# Patient Record
Sex: Male | Born: 1980 | Hispanic: No | Marital: Married | Smoking: Never smoker
Health system: Southern US, Community
[De-identification: ages and names within clinical notes are randomized; demographics above are authoritative.]

## PROBLEM LIST (undated history)

## (undated) DIAGNOSIS — K219 Gastro-esophageal reflux disease without esophagitis: Secondary | ICD-10-CM

## (undated) HISTORY — PX: UPPER GI ENDOSCOPY: SHX6162

---

## 2016-12-17 ENCOUNTER — Emergency Department (HOSPITAL_COMMUNITY)
Admission: EM | Admit: 2016-12-17 | Discharge: 2016-12-17 | Disposition: A | Discharge status: Home / Self Care | Payer: BLUE CROSS/BLUE SHIELD | Attending: Emergency Medicine | Admitting: Emergency Medicine

## 2016-12-17 ENCOUNTER — Encounter (HOSPITAL_COMMUNITY): Payer: Self-pay

## 2016-12-17 DIAGNOSIS — Z5321 Procedure and treatment not carried out due to patient leaving prior to being seen by health care provider: Secondary | ICD-10-CM | POA: Insufficient documentation

## 2016-12-17 DIAGNOSIS — R1013 Epigastric pain: Secondary | ICD-10-CM | POA: Diagnosis present

## 2016-12-17 HISTORY — DX: Gastro-esophageal reflux disease without esophagitis: K21.9

## 2016-12-17 LAB — COMPREHENSIVE METABOLIC PANEL
ALK PHOS: 53 U/L (ref 38–126)
ALT: 24 U/L (ref 17–63)
ANION GAP: 11 (ref 5–15)
AST: 28 U/L (ref 15–41)
Albumin: 3.8 g/dL (ref 3.5–5.0)
BILIRUBIN TOTAL: 0.5 mg/dL (ref 0.3–1.2)
BUN: 13 mg/dL (ref 6–20)
CALCIUM: 9.1 mg/dL (ref 8.9–10.3)
CO2: 23 mmol/L (ref 22–32)
Chloride: 106 mmol/L (ref 101–111)
Creatinine, Ser: 1.16 mg/dL (ref 0.61–1.24)
GFR calc Af Amer: >60 mL/min (ref >60)
Glucose, Bld: 88 mg/dL (ref 65–99)
POTASSIUM: 3.6 mmol/L (ref 3.5–5.1)
Sodium: 140 mmol/L (ref 135–145)
TOTAL PROTEIN: 6.4 g/dL — AB (ref 6.5–8.1)

## 2016-12-17 LAB — CBC
HEMATOCRIT: 40.9 % (ref 39.0–52.0)
Hemoglobin: 14 g/dL (ref 13.0–17.0)
MCH: 28.9 pg (ref 26.0–34.0)
MCHC: 34.2 g/dL (ref 30.0–36.0)
MCV: 84.5 fL (ref 78.0–100.0)
Platelets: 216 10*3/uL (ref 150–400)
RBC: 4.84 MIL/uL (ref 4.22–5.81)
RDW: 12.1 % (ref 11.5–15.5)
WBC: 6.8 10*3/uL (ref 4.0–10.5)

## 2016-12-17 LAB — TYPE AND SCREEN
ABO/RH(D): B POS
ANTIBODY SCREEN: NEGATIVE

## 2016-12-17 LAB — LIPASE, BLOOD: Lipase: 65 U/L — ABNORMAL HIGH (ref 11–51)

## 2016-12-17 LAB — ABO/RH: ABO/RH(D): B POS

## 2016-12-17 NOTE — ED Triage Notes (Signed)
Pt states that he also noticed tonight around 8pm he started to have a black stool.

## 2016-12-17 NOTE — ED Triage Notes (Signed)
Pt states that he began to have upper epigastric pain tonight around 8pm along with nausea and no vomiting. Denies CP/SOB hx of GERD

## 2016-12-17 NOTE — ED Notes (Signed)
Pt states that he feels much better and wishes to leave, will return if he feels worse.

## 2017-02-19 ENCOUNTER — Observation Stay (HOSPITAL_COMMUNITY)
Admission: EM | Admit: 2017-02-19 | Discharge: 2017-02-19 | Disposition: A | Discharge status: Still In Hospital | Payer: BLUE CROSS/BLUE SHIELD | Source: Home / Self Care | Attending: Emergency Medicine | Admitting: Emergency Medicine

## 2017-02-19 ENCOUNTER — Other Ambulatory Visit: Payer: Self-pay

## 2017-02-19 ENCOUNTER — Ambulatory Visit (HOSPITAL_COMMUNITY): Payer: BLUE CROSS/BLUE SHIELD | Admitting: Anesthesiology

## 2017-02-19 ENCOUNTER — Encounter (HOSPITAL_COMMUNITY)
Admission: EM | Disposition: A | Discharge status: Still In Hospital | Payer: Self-pay | Source: Home / Self Care | Attending: Emergency Medicine

## 2017-02-19 ENCOUNTER — Encounter (HOSPITAL_COMMUNITY): Payer: Self-pay | Admitting: Emergency Medicine

## 2017-02-19 ENCOUNTER — Encounter (HOSPITAL_BASED_OUTPATIENT_CLINIC_OR_DEPARTMENT_OTHER): Payer: Self-pay | Admitting: *Deleted

## 2017-02-19 ENCOUNTER — Ambulatory Visit (HOSPITAL_BASED_OUTPATIENT_CLINIC_OR_DEPARTMENT_OTHER): Payer: BLUE CROSS/BLUE SHIELD | Admitting: Anesthesiology

## 2017-02-19 ENCOUNTER — Ambulatory Visit (HOSPITAL_BASED_OUTPATIENT_CLINIC_OR_DEPARTMENT_OTHER)
Admission: RE | Admit: 2017-02-19 | Discharge: 2017-02-19 | Disposition: A | Discharge status: Home / Self Care | Payer: BLUE CROSS/BLUE SHIELD | Source: Ambulatory Visit | Attending: Orthopedic Surgery | Admitting: Orthopedic Surgery

## 2017-02-19 ENCOUNTER — Encounter (HOSPITAL_BASED_OUTPATIENT_CLINIC_OR_DEPARTMENT_OTHER)
Admission: RE | Disposition: A | Discharge status: Home / Self Care | Payer: Self-pay | Source: Ambulatory Visit | Attending: Orthopedic Surgery

## 2017-02-19 ENCOUNTER — Emergency Department (HOSPITAL_COMMUNITY): Payer: BLUE CROSS/BLUE SHIELD

## 2017-02-19 DIAGNOSIS — K219 Gastro-esophageal reflux disease without esophagitis: Secondary | ICD-10-CM

## 2017-02-19 DIAGNOSIS — S82032A Displaced transverse fracture of left patella, initial encounter for closed fracture: Secondary | ICD-10-CM

## 2017-02-19 DIAGNOSIS — S82002A Unspecified fracture of left patella, initial encounter for closed fracture: Secondary | ICD-10-CM | POA: Diagnosis present

## 2017-02-19 DIAGNOSIS — Z79899 Other long term (current) drug therapy: Secondary | ICD-10-CM | POA: Insufficient documentation

## 2017-02-19 DIAGNOSIS — S82009A Unspecified fracture of unspecified patella, initial encounter for closed fracture: Secondary | ICD-10-CM | POA: Diagnosis present

## 2017-02-19 DIAGNOSIS — W000XXA Fall on same level due to ice and snow, initial encounter: Secondary | ICD-10-CM | POA: Insufficient documentation

## 2017-02-19 DIAGNOSIS — S82042A Displaced comminuted fracture of left patella, initial encounter for closed fracture: Secondary | ICD-10-CM

## 2017-02-19 HISTORY — PX: ORIF PATELLA: SHX5033

## 2017-02-19 SURGERY — OPEN REDUCTION INTERNAL FIXATION (ORIF) PATELLA
Anesthesia: General | Laterality: Left

## 2017-02-19 SURGERY — OPEN REDUCTION INTERNAL FIXATION (ORIF) PATELLA
Anesthesia: General | Site: Knee | Laterality: Left

## 2017-02-19 MED ORDER — ASPIRIN EC 81 MG PO TBEC: 81.0000 mg | DELAYED_RELEASE_TABLET | Freq: Every day | ORAL | 0 refills | Status: AC

## 2017-02-19 MED ORDER — LACTATED RINGERS IV SOLN: INTRAVENOUS | Status: DC

## 2017-02-19 MED ORDER — PANTOPRAZOLE SODIUM 40 MG PO TBEC
40.0000 mg | DELAYED_RELEASE_TABLET | Freq: Every day | ORAL | Status: DC
  Administered 2017-02-19: 40 mg via ORAL
  Filled 2017-02-19: qty 1

## 2017-02-19 MED ORDER — MIDAZOLAM HCL 2 MG/2ML IJ SOLN
1.0000 mg | INTRAMUSCULAR | Status: DC | PRN
  Administered 2017-02-19: 1 mg via INTRAVENOUS

## 2017-02-19 MED ORDER — FENTANYL CITRATE (PF) 100 MCG/2ML IJ SOLN
INTRAMUSCULAR | Status: AC
  Filled 2017-02-19: qty 2

## 2017-02-19 MED ORDER — HYDROMORPHONE HCL 1 MG/ML IJ SOLN: 0.2500 mg | INTRAMUSCULAR | Status: DC | PRN

## 2017-02-19 MED ORDER — OXYCODONE HCL 5 MG PO TABS: 5.0000 mg | ORAL_TABLET | ORAL | 0 refills | Status: AC | PRN

## 2017-02-19 MED ORDER — ONDANSETRON HCL 4 MG PO TABS: 4.0000 mg | ORAL_TABLET | Freq: Three times a day (TID) | ORAL | 0 refills | Status: AC | PRN

## 2017-02-19 MED ORDER — CHLORHEXIDINE GLUCONATE 4 % EX LIQD: 60.0000 mL | Freq: Once | CUTANEOUS | Status: DC

## 2017-02-19 MED ORDER — MIDAZOLAM HCL 2 MG/2ML IJ SOLN
INTRAMUSCULAR | Status: AC
  Filled 2017-02-19: qty 2

## 2017-02-19 MED ORDER — LIDOCAINE 2% (20 MG/ML) 5 ML SYRINGE
INTRAMUSCULAR | Status: AC
  Filled 2017-02-19: qty 5

## 2017-02-19 MED ORDER — GABAPENTIN 300 MG PO CAPS
300.0000 mg | ORAL_CAPSULE | Freq: Once | ORAL | Status: AC
  Administered 2017-02-19: 300 mg via ORAL

## 2017-02-19 MED ORDER — METHOCARBAMOL 500 MG PO TABS: 500.0000 mg | ORAL_TABLET | Freq: Four times a day (QID) | ORAL | 0 refills | Status: AC | PRN

## 2017-02-19 MED ORDER — HYDROMORPHONE HCL 1 MG/ML IJ SOLN
1.0000 mg | INTRAMUSCULAR | Status: DC | PRN
  Administered 2017-02-19 (×2): 1 mg via INTRAVENOUS
  Filled 2017-02-19 (×2): qty 1

## 2017-02-19 MED ORDER — ACETAMINOPHEN 500 MG PO TABS: 1000.0000 mg | ORAL_TABLET | Freq: Three times a day (TID) | ORAL | 0 refills | Status: AC

## 2017-02-19 MED ORDER — DEXAMETHASONE SODIUM PHOSPHATE 10 MG/ML IJ SOLN
INTRAMUSCULAR | Status: AC
  Filled 2017-02-19: qty 1

## 2017-02-19 MED ORDER — PROPOFOL 10 MG/ML IV BOLUS
INTRAVENOUS | Status: AC
  Filled 2017-02-19: qty 40

## 2017-02-19 MED ORDER — ONDANSETRON HCL 4 MG/2ML IJ SOLN
INTRAMUSCULAR | Status: DC | PRN
  Administered 2017-02-19: 4 mg via INTRAVENOUS

## 2017-02-19 MED ORDER — CEFAZOLIN SODIUM-DEXTROSE 2-4 GM/100ML-% IV SOLN
2.0000 g | INTRAVENOUS | Status: AC
  Administered 2017-02-19: 2 g via INTRAVENOUS

## 2017-02-19 MED ORDER — ONDANSETRON HCL 4 MG/2ML IJ SOLN
INTRAMUSCULAR | Status: AC
  Filled 2017-02-19: qty 2

## 2017-02-19 MED ORDER — SCOPOLAMINE 1 MG/3DAYS TD PT72: 1.0000 | MEDICATED_PATCH | Freq: Once | TRANSDERMAL | Status: DC | PRN

## 2017-02-19 MED ORDER — ACETAMINOPHEN 500 MG PO TABS
ORAL_TABLET | ORAL | Status: AC
  Filled 2017-02-19: qty 2

## 2017-02-19 MED ORDER — CEFAZOLIN SODIUM-DEXTROSE 2-4 GM/100ML-% IV SOLN
INTRAVENOUS | Status: AC
  Filled 2017-02-19: qty 100

## 2017-02-19 MED ORDER — LACTATED RINGERS IV SOLN
INTRAVENOUS | Status: DC
  Administered 2017-02-19 (×3): via INTRAVENOUS

## 2017-02-19 MED ORDER — GABAPENTIN 300 MG PO CAPS
ORAL_CAPSULE | ORAL | Status: AC
  Filled 2017-02-19: qty 1

## 2017-02-19 MED ORDER — HYDROMORPHONE HCL 1 MG/ML IJ SOLN
1.0000 mg | Freq: Once | INTRAMUSCULAR | Status: AC
  Administered 2017-02-19: 1 mg via INTRAVENOUS
  Filled 2017-02-19: qty 1

## 2017-02-19 MED ORDER — PROPOFOL 10 MG/ML IV BOLUS
INTRAVENOUS | Status: DC | PRN
Start: 2017-02-19 — End: 2017-02-19
  Administered 2017-02-19: 200 mg via INTRAVENOUS
  Administered 2017-02-19: 50 mg via INTRAVENOUS

## 2017-02-19 MED ORDER — BUPIVACAINE-EPINEPHRINE (PF) 0.5% -1:200000 IJ SOLN
INTRAMUSCULAR | Status: DC | PRN
  Administered 2017-02-19: 30 mL via PERINEURAL

## 2017-02-19 MED ORDER — MORPHINE SULFATE (PF) 4 MG/ML IV SOLN
4.0000 mg | Freq: Once | INTRAVENOUS | Status: AC
  Administered 2017-02-19: 4 mg via INTRAVENOUS
  Filled 2017-02-19: qty 1

## 2017-02-19 MED ORDER — DOCUSATE SODIUM 100 MG PO CAPS: 100.0000 mg | ORAL_CAPSULE | Freq: Two times a day (BID) | ORAL | 0 refills | Status: AC

## 2017-02-19 MED ORDER — DEXAMETHASONE SODIUM PHOSPHATE 10 MG/ML IJ SOLN
INTRAMUSCULAR | Status: DC | PRN
  Administered 2017-02-19: 10 mg via INTRAVENOUS

## 2017-02-19 MED ORDER — ACETAMINOPHEN 500 MG PO TABS
1000.0000 mg | ORAL_TABLET | Freq: Once | ORAL | Status: AC
  Administered 2017-02-19: 1000 mg via ORAL

## 2017-02-19 MED ORDER — FENTANYL CITRATE (PF) 100 MCG/2ML IJ SOLN
50.0000 ug | INTRAMUSCULAR | Status: DC | PRN
  Administered 2017-02-19: 25 ug via INTRAVENOUS
  Administered 2017-02-19: 50 ug via INTRAVENOUS

## 2017-02-19 MED ORDER — LIDOCAINE 2% (20 MG/ML) 5 ML SYRINGE
INTRAMUSCULAR | Status: DC | PRN
  Administered 2017-02-19: 40 mg via INTRAVENOUS

## 2017-02-19 MED ORDER — ONDANSETRON HCL 4 MG/2ML IJ SOLN: 4.0000 mg | Freq: Three times a day (TID) | INTRAMUSCULAR | Status: DC | PRN

## 2017-02-19 SURGICAL SUPPLY — 61 items
BANDAGE ACE 4X5 VEL STRL LF (GAUZE/BANDAGES/DRESSINGS) ×4 IMPLANT
BANDAGE ESMARK 6X9 LF (GAUZE/BANDAGES/DRESSINGS) ×2 IMPLANT
BIT DRILL 2.6 CANN (BIT) ×2 IMPLANT
BLADE SURG 15 STRL LF DISP TIS (BLADE) ×4 IMPLANT
BLADE SURG 15 STRL SS (BLADE) ×4
BNDG COHESIVE 4X5 TAN STRL (GAUZE/BANDAGES/DRESSINGS) IMPLANT
BNDG ESMARK 6X9 LF (GAUZE/BANDAGES/DRESSINGS) ×4
CHLORAPREP W/TINT 26ML (MISCELLANEOUS) ×4 IMPLANT
CLSR STERI-STRIP ANTIMIC 1/2X4 (GAUZE/BANDAGES/DRESSINGS) ×4 IMPLANT
CUFF TOURNIQUET SINGLE 24IN (TOURNIQUET CUFF) IMPLANT
CUFF TOURNIQUET SINGLE 34IN LL (TOURNIQUET CUFF) IMPLANT
DECANTER SPIKE VIAL GLASS SM (MISCELLANEOUS) IMPLANT
DRAPE C-ARM 42X72 X-RAY (DRAPES) ×4 IMPLANT
DRAPE C-ARMOR (DRAPES) ×4 IMPLANT
DRAPE EXTREMITY T 121X128X90 (DRAPE) ×4 IMPLANT
DRAPE OEC MINIVIEW 54X84 (DRAPES) ×4 IMPLANT
DRAPE U-SHAPE 47X51 STRL (DRAPES) IMPLANT
DRSG EMULSION OIL 3X3 NADH (GAUZE/BANDAGES/DRESSINGS) IMPLANT
ELECT REM PT RETURN 9FT ADLT (ELECTROSURGICAL) ×4
ELECTRODE REM PT RTRN 9FT ADLT (ELECTROSURGICAL) ×2 IMPLANT
FIBERTAPE 2 W/STRL NDL 17 (SUTURE) ×2 IMPLANT
GAUZE SPONGE 4X4 12PLY STRL (GAUZE/BANDAGES/DRESSINGS) ×4 IMPLANT
GLOVE BIO SURGEON STRL SZ7.5 (GLOVE) ×8 IMPLANT
GLOVE BIOGEL PI IND STRL 8 (GLOVE) ×4 IMPLANT
GLOVE BIOGEL PI INDICATOR 8 (GLOVE) ×4
GOWN STRL REUS W/ TWL LRG LVL3 (GOWN DISPOSABLE) ×8 IMPLANT
GOWN STRL REUS W/ TWL XL LVL3 (GOWN DISPOSABLE) ×2 IMPLANT
GOWN STRL REUS W/TWL LRG LVL3 (GOWN DISPOSABLE) ×8
GOWN STRL REUS W/TWL XL LVL3 (GOWN DISPOSABLE) ×2
IMMOBILIZER KNEE 22 UNIV (SOFTGOODS) IMPLANT
IMMOBILIZER KNEE 24 THIGH 36 (MISCELLANEOUS) IMPLANT
IMMOBILIZER KNEE 24 UNIV (MISCELLANEOUS)
K-WIRE 1.6X200 (WIRE) ×4
KWIRE 1.6X200 (WIRE) ×2 IMPLANT
NEEDLE HYPO 22GX1.5 SAFETY (NEEDLE) IMPLANT
NS IRRIG 1000ML POUR BTL (IV SOLUTION) ×4 IMPLANT
PACK ARTHROSCOPY DSU (CUSTOM PROCEDURE TRAY) IMPLANT
PACK BASIN DAY SURGERY FS (CUSTOM PROCEDURE TRAY) ×4 IMPLANT
PAD CAST 4YDX4 CTTN HI CHSV (CAST SUPPLIES) ×2 IMPLANT
PADDING CAST COTTON 4X4 STRL (CAST SUPPLIES) ×2
PENCIL BUTTON HOLSTER BLD 10FT (ELECTRODE) ×4 IMPLANT
SCREW 4X36MM CANN LO-PRO (Screw) ×2 IMPLANT
SCREW CANN 4X34 LO PRO (Screw) ×2 IMPLANT
SLEEVE SCD COMPRESS KNEE MED (MISCELLANEOUS) IMPLANT
SPLINT FAST PLASTER 5X30 (CAST SUPPLIES)
SPLINT PLASTER CAST FAST 5X30 (CAST SUPPLIES) IMPLANT
SPONGE LAP 4X18 X RAY DECT (DISPOSABLE) ×4 IMPLANT
SUCTION FRAZIER HANDLE 10FR (MISCELLANEOUS)
SUCTION TUBE FRAZIER 10FR DISP (MISCELLANEOUS) IMPLANT
SUT ETHILON 3 0 PS 1 (SUTURE) ×4 IMPLANT
SUT MON AB 2-0 CT1 36 (SUTURE) ×4 IMPLANT
SUT MON AB 4-0 PC3 18 (SUTURE) ×4 IMPLANT
SUT VIC AB 0 SH 27 (SUTURE) IMPLANT
SUT VIC AB 2-0 SH 27 (SUTURE)
SUT VIC AB 2-0 SH 27XBRD (SUTURE) IMPLANT
SYR BULB 3OZ (MISCELLANEOUS) ×4 IMPLANT
TOWEL OR 17X24 6PK STRL BLUE (TOWEL DISPOSABLE) ×4 IMPLANT
TOWEL OR NON WOVEN STRL DISP B (DISPOSABLE) ×4 IMPLANT
TUBE CONNECTING 20X1/4 (TUBING) IMPLANT
UNDERPAD 30X30 (UNDERPADS AND DIAPERS) ×4 IMPLANT
YANKAUER SUCT BULB TIP NO VENT (SUCTIONS) ×4 IMPLANT

## 2017-02-19 NOTE — Progress Notes (Signed)
Discharge paperwork reviewed with patient. No questions verbalized.  [facility] called to set up transport to the [facility].  Will continue to monitor.

## 2017-02-19 NOTE — Anesthesia Postprocedure Evaluation (Signed)
Anesthesia Post Note  Patient: Nathan Davenport  Procedure(s) Performed: OPEN REDUCTION INTERNAL (ORIF) FIXATION PATELLA (Left Knee)     Patient location during evaluation: PACU Anesthesia Type: General Level of consciousness: awake and alert Pain management: pain level controlled Vital Signs Assessment: post-procedure vital signs reviewed and stable Respiratory status: spontaneous breathing, nonlabored ventilation, respiratory function stable and patient connected to nasal cannula oxygen Cardiovascular status: blood pressure returned to baseline and stable Postop Assessment: no apparent nausea or vomiting Anesthetic complications: no    Last Vitals:  Vitals:   02/19/17 1500 02/19/17 1515  BP: (!) 100/57 112/67  Pulse: 92 84  Resp: 11 11  Temp:    SpO2: 100% 94%    Last Pain:  Vitals:   02/19/17 1500  TempSrc:   PainSc: 0-No pain                 Phillips Groutarignan, Myrtice Lowdermilk

## 2017-02-19 NOTE — ED Triage Notes (Signed)
Per [facility], Pt slipped and fell in parking lot, landed on L knee. Pt has deformity to L knee. Pt denies loss of consciousness, denies hitting head, denies blood thinners. Pt received 250 mcg fentanyl en route. Pt alert, reports pain 5/10.

## 2017-02-19 NOTE — ED Provider Notes (Signed)
[facility] Provider Note   CSN: [id number] Arrival date & time: 02/19/17  0000     History   Chief Complaint Chief Complaint  Patient presents with  . Knee Injury    HPI Nathan AmabileSameh Davenport is a 36 y.o. male.  The history is provided by the patient and medical records.    36 year old male with hx of GERD, presenting to the ED after a fall. Patient reports he slipped on some black ice at his house and fell directly onto the left knee.  States as soon as he fell he had immediate pain and noted deformity to the left knee.  Unable to get up and walk around afterwards.  No head injury or LOC.  Not on anticoagulation.  No prior orthopedic injuries.  Given fentanyl en route with some relief of pain.  Past Medical History:  Diagnosis Date  . GERD (gastroesophageal reflux disease)     There are no active problems to display for this patient.   Past Surgical History:  Procedure Laterality Date  . UPPER GI ENDOSCOPY         Home Medications    Prior to Admission medications   Not on File    Family History No family history on file.  Social History Social History   Tobacco Use  . Smoking status: Never Smoker  . Smokeless tobacco: Never Used  Substance Use Topics  . Alcohol use: No  . Drug use: Not on file     Allergies   Sulfa antibiotics   Review of Systems Review of Systems  Musculoskeletal: Positive for arthralgias.  All other systems reviewed and are negative.    Physical Exam Updated Vital Signs BP 132/72 (BP Location: Right Arm)   Pulse (!) 103   Temp 98.4 F (36.9 C) (Oral)   Resp 20   Ht 5\' 11"  (1.803 m)   Wt 91.2 kg (201 lb)   SpO2 97%   BMI 28.03 kg/m   Physical Exam  Constitutional: He is oriented to person, place, and time. He appears well-developed and well-nourished.  HENT:  Head: Normocephalic and atraumatic.  Mouth/Throat: Oropharynx is clear and moist.  Eyes: Conjunctivae and EOM are  normal. Pupils are equal, round, and reactive to light.  Neck: Normal range of motion.  Cardiovascular: Normal rate, regular rhythm and normal heart sounds.  Pulmonary/Chest: Effort normal and breath sounds normal. No stridor. No respiratory distress.  Abdominal: Soft. Bowel sounds are normal.  Musculoskeletal: Normal range of motion.  Apparent deformity of the patella, appears to be fractured with fragments displaced; no skin tenting; limited range of motion secondary to this, DP pulse intact, foot is warm and well-perfused  Neurological: He is alert and oriented to person, place, and time.  Skin: Skin is warm and dry.  Psychiatric: He has a normal mood and affect.  Nursing note and vitals reviewed.    ED Treatments / Results  Labs (all labs ordered are listed, but only abnormal results are displayed) Labs Reviewed - No data to display  EKG  EKG Interpretation None       Radiology Dg Knee Complete 4 Views Left  Result Date: 02/19/2017 CLINICAL DATA:  Larey SeatFell on the ice tonight. EXAM: LEFT KNEE - COMPLETE 4+ VIEW COMPARISON:  None. FINDINGS: Comminuted patellar fracture sugar with marked fracture fragment separation and rotation. Femur, tibia and fibula appear intact. IMPRESSION: Comminuted, widely separated patellar fracture. Electronically Signed   By: Ellery Plunkaniel R Mitchell M.D.   On:  02/19/2017 01:38    Procedures Procedures (including critical care time)  Medications Ordered in ED Medications  ondansetron (ZOFRAN) injection 4 mg (not administered)  HYDROmorphone (DILAUDID) injection 1 mg (1 mg Intravenous Given 02/19/17 0321)  morphine 4 MG/ML injection 4 mg (4 mg Intravenous Given 02/19/17 0031)  HYDROmorphone (DILAUDID) injection 1 mg (1 mg Intravenous Given 02/19/17 0117)     Initial Impression / Assessment and Plan / ED Course  I have reviewed the triage vital signs and the nursing notes.  Pertinent labs & imaging results that were available during my care of the  patient were reviewed by me and considered in my medical decision making (see chart for details).  36 year old male here after slip and fall on ice at home.  He was evaluated immediately upon arrival.  On exam he has obvious deformity of the left knee consistent with a patella fracture which I discussed with him.  X-ray studies were obtained confirming such.  He is to be placed in knee immobilizer with crutches. Leg remains neurovascular intact, no skin tenting or compromise.  Patient somewhat concerned about follow-up but after discussing with him, he is comfortable and acknowledged understanding.  Orthopedic surgery paged to facilitate follow-up.  1:44 AM Spoke with Dr. Eulah PontMurphy with orthopedics-- he has reviewed images.  He will see patient in office this morning at 8:30AM (<7 hours from now), can schedule surgery later this week, as early as tomorrow.  Agrees with immobilizer and oral meds.  Patient and wife updated, they acknowledged understanding.  Additional meds ordered prior to immobilizer being put on.  1:52 AM Called back into room by friends of patient (hospitalist physicians) that have now arrived as they have been knocking on provider room door and looking through windows. They are now refusing to let him be discharged.  Patient states pain is too great at this time, informed him I have ordered additional medications.  He had some concerns about desaturation with fentanyl, however was given a very large dose (250 mcg) on the way here with EMS which i feel is the culprit so have held further doses of this. Friends at bedside are somewhat aggressive and hostile towards me-- they are unhappy that he is not having surgery tonight and I have tried to explain reasoning, however they continue interrupting me.  They are adamant that he be evaluated by a hospitalist (direct words "I suggest you call a hospitalist and get him admitted because this is unacceptable") at this time even though I have explained  that medicine team likely will not offer any additional benefit at this time as he has an isolated orthopedic injury.  I have tried to explain planning further, however they will not let me speak as they continue to deem my plan "unaccepable".  I voiced that I would place a call but could not promise anything as discharge home with OP follow-up is standard of care.  They continue coming in and out of room, knocking on doors and peering through provider room windows even after being informed I was on the phone with another consultation about a different patient.  1:57 AM Have reached back out to Dr. Eulah PontMurphy-- he has spoken with patient one on one over the phone.  He agrees that outpatient plan was correct, however since patient is refusing to leave and friends are being difficult, will admit for observation.  He made patient aware that he could have surgical intervention done faster at OP center, patient still refusing.  I  have placed temporary admission orders.  Patient to remain NPO.  2:20 AM Notified by secretary that patient's friends have been standing around her desk asking for hospitalist and/or other physicians to be contacted.  They have been told of the new plan and told to go back into room for HIPPA reasons.  Charge nurse has been notified of situation as well.    Patient's visitors were observed walking into the provider lounge on the opposite side of ED to talk with other physician that was not involved in case after being told to wait in exam room by secretary staff.  Apparently, they were voicing to other staff members that "nothing was being done".  Attending physician, Dr. Nicanor Alcon, aware of patient's injuries/ treatment plan and is in agreement with such.  She also has witnessed the behavior of patient's guests.  Final Clinical Impressions(s) / ED Diagnoses   Final diagnoses:  Closed displaced transverse fracture of left patella, initial encounter    ED Discharge Orders    None        Garlon Hatchet, PA-C 02/19/17 0426    Palumbo, April, MD 02/19/17 0500

## 2017-02-19 NOTE — Op Note (Signed)
02/19/2017  1:42 PM  PATIENT:  Nathan Davenport    PRE-OPERATIVE DIAGNOSIS:  fracture patella left  POST-OPERATIVE DIAGNOSIS:  Same  PROCEDURE:  OPEN REDUCTION INTERNAL (ORIF) FIXATION PATELLA  SURGEON:  Jerie Basford D, MD  PHYSICIAN ASSISTANT: Aquilla HackerHenry Martensen, PA-C, he was present and scrubbed throughout the case, critical for completion in a timely fashion, and for retraction, instrumentation, and closure.   ANESTHESIA:   General  PREOPERATIVE INDICATIONS:  Nathan Davenport is a  36 y.o. male with a diagnosis of fracture patella left who elected for surgical management in order to restore the function of the extensor mechanism.    The risks benefits and alternatives were discussed with the patient preoperatively including but not limited to the risks of infection, bleeding, nerve injury, cardiopulmonary complications, the need for revision surgery, hardware prominence, hardware failure, the need for hardware removal, nonunion, malunion, posttraumatic arthritis, stiffness, loss of strength and function, among others, and the patient was willing to proceed.  OPERATIVE IMPLANTS: 4.0 mm cannulated screws x2 with a total of 2 #2 FiberWire going through the cannulated screws in a figure-of-eight cerclage fashion  OPERATIVE FINDINGS: Displaced patella fracture  OPERATIVE PROCEDURE: The patient was brought to the operating room and placed in the supine position. General anesthesia was administered. IV antibiotics were given. The lower extremity was prepped and draped in usual sterile fashion. The leg was elevated and exsanguinated and the tourniquet was inflated. Time out was performed.   Anterior incision was made over the patella and the fracture fragments identified and cleaned of hematoma. The retinaculum was torn on either side.  Unfortunately he had a large amount of comminution in his inferior patella.  I was able to hold this into place and obtain an articular reduction.  Is able to get  purchase with 2 screws.  I reduced the fracture anatomically and held provisionally with a clamp and placed 2 guidewires for the cannulated screws.  The lengths were measured, after being confirmed on C-arm, and then I placed the screws, taking care to make sure that there were threads only on the proximal segment, providing compression at the fracture site, and the tips were not prominent proximally.  C-arm used to confirm reduction and position of the screws, and once I was satisfied with this I then used a Keith needle through the screws bringing a total of 2 #2 FiberWire in a figure-of-eight type fashion. This provided excellent secondary fixation. I left the screw lengths slightly short of the far cortex in order to minimize the risk for rupture of the FiberWire over the tip of the screws.  I used a fiber tape to place a cerclage stitch around his entire patella to contain the inferior comminution.  He was stable to 20 degrees of flexion.  The wounds were irrigated copiously.  I performed a repair of the collateral joint capsul and extensor retinaculum that had ruptured. I was happy with this closure.   I used Vicryl for the subcutaneous tissue with Steri-Strips and sterile gauze for the skin. The wounds were also injected. A knee immobilizer was applied. The patient was awakened and returned to the PACU in stable and satisfactory condition. There were no complications.   POSTOPERATIVE PLAN: WBAT in knee immobilizer, DVT px: ambulation and ASA

## 2017-02-19 NOTE — Anesthesia Procedure Notes (Signed)
Procedure Name: LMA Insertion Date/Time: 02/19/2017 12:47 PM Performed by: Burna Cashonrad, Carmin Alvidrez C, CRNA Pre-anesthesia Checklist: Patient identified, Emergency Drugs available, Suction available and Patient being monitored Patient Re-evaluated:Patient Re-evaluated prior to induction Oxygen Delivery Method: Circle system utilized Preoxygenation: Pre-oxygenation with 100% oxygen Induction Type: IV induction Ventilation: Mask ventilation without difficulty LMA: LMA inserted LMA Size: 5.0 Number of attempts: 1 Airway Equipment and Method: Bite block Placement Confirmation: positive ETCO2 Tube secured with: Tape Dental Injury: Teeth and Oropharynx as per pre-operative assessment

## 2017-02-19 NOTE — Anesthesia Preprocedure Evaluation (Deleted)
Anesthesia Evaluation  Patient identified by MRN, date of birth, ID band Patient awake    Reviewed: Allergy & Precautions, H&P , NPO status , Patient's Chart, lab work & pertinent test results  Airway Mallampati: II  TM Distance: >3 FB Neck ROM: Full    Dental no notable dental hx. (+) Teeth Intact, Dental Advisory Given   Pulmonary neg pulmonary ROS,    Pulmonary exam normal breath sounds clear to auscultation       Cardiovascular negative cardio ROS   Rhythm:Regular Rate:Normal     Neuro/Psych negative neurological ROS  negative psych ROS   GI/Hepatic Neg liver ROS, GERD  Medicated and Controlled,  Endo/Other  negative endocrine ROS  Renal/GU negative Renal ROS  negative genitourinary   Musculoskeletal   Abdominal   Peds  Hematology negative hematology ROS (+)   Anesthesia Other Findings   Reproductive/Obstetrics negative OB ROS                            Anesthesia Physical Anesthesia Plan  ASA: II  Anesthesia Plan: General   Post-op Pain Management:  Regional for Post-op pain   Induction: Intravenous  PONV Risk Score and Plan: 3 and Ondansetron, Dexamethasone and Midazolam  Airway Management Planned: LMA  Additional Equipment:   Intra-op Plan:   Post-operative Plan: Extubation in OR  Informed Consent: I have reviewed the patients History and Physical, chart, labs and discussed the procedure including the risks, benefits and alternatives for the proposed anesthesia with the patient or authorized representative who has indicated his/her understanding and acceptance.   Dental advisory given  Plan Discussed with: CRNA  Anesthesia Plan Comments:         Anesthesia Quick Evaluation  

## 2017-02-19 NOTE — Discharge Instructions (Signed)
Elevate leg - Toes above nose as much as possible to reduce pain / swelling.  Weight Bearing:  As tolerated - Maintain splint.  Utilize knee immobilizer in addition to splint as needed to keep leg completely straight at all times.  Diet: As you were doing prior to hospitalization   Shower:  You have a splint on, leave the splint in place and keep the splint dry with a plastic bag.  Dressing:  You have a splint. Leave the splint in place and we will change your bandages during your first follow-up appointment.    Activity:  Increase activity slowly as tolerated, but follow the weight bearing instructions below.  The rules on driving is that you can not be taking narcotics while you drive, and you must feel in control of the vehicle.    To prevent constipation:  Narcotic medicines cause constipation.  Wean these as soon as is appropriate.   You may use a stool softener such as -  Colace (over the counter) 100 mg by mouth twice a day  Drink plenty of fluids (prune juice may be helpful) and high fiber foods Miralax (over the counter) for constipation as needed.    Itching:  If you experience itching with your medications, try taking only a single pain pill, or even half a pain pill at a time.  You can also use benadryl over the counter for itching or also to help with sleep.   Precautions:  If you experience chest pain or shortness of breath - call 911 immediately for transfer to the hospital emergency department!!  If you develop a fever greater that 101 F, purulent drainage from wound, increased redness or drainage from wound, or calf pain -- Call the office at [phone]                                                 Follow- Up Appointment:  Please call for an appointment to be seen in 1-2 weeks [place] - [phone]   Post Anesthesia Home Care Instructions  Activity: Get plenty of rest for the remainder of the day. A responsible individual must stay with you for 24 hours  following the procedure.  For the next 24 hours, DO NOT: -Drive a car -Advertising copywriterperate machinery -Drink alcoholic beverages -Take any medication unless instructed by your physician -Make any legal decisions or sign important papers.  Meals: Start with liquid foods such as gelatin or soup. Progress to regular foods as tolerated. Avoid greasy, spicy, heavy foods. If nausea and/or vomiting occur, drink only clear liquids until the nausea and/or vomiting subsides. Call your physician if vomiting continues.  Special Instructions/Symptoms: Your throat may feel dry or sore from the anesthesia or the breathing tube placed in your throat during surgery. If this causes discomfort, gargle with warm salt water. The discomfort should disappear within 24 hours.  If you had a scopolamine patch placed behind your ear for the management of post- operative nausea and/or vomiting:  1. The medication in the patch is effective for 72 hours, after which it should be removed.  Wrap patch in a tissue and discard in the trash. Wash hands thoroughly with soap and water. 2. You may remove the patch earlier than 72 hours if you experience unpleasant side effects which may include dry mouth, dizziness or visual disturbances. 3. Avoid touching the  patch. Wash your hands with soap and water after contact with the patch.   Regional Anesthesia Blocks  1. Numbness or the inability to move the "blocked" extremity may last from 3-48 hours after placement. The length of time depends on the medication injected and your individual response to the medication. If the numbness is not going away after 48 hours, call your surgeon.  2. The extremity that is blocked will need to be protected until the numbness is gone and the  Strength has returned. Because you cannot feel it, you will need to take extra care to avoid injury. Because it may be weak, you may have difficulty moving it or using it. You may not know what position it is in without  looking at it while the block is in effect.  3. For blocks in the legs and feet, returning to weight bearing and walking needs to be done carefully. You will need to wait until the numbness is entirely gone and the strength has returned. You should be able to move your leg and foot normally before you try and bear weight or walk. You will need someone to be with you when you first try to ensure you do not fall and possibly risk injury.  4. Bruising and tenderness at the needle site are common side effects and will resolve in a few days.  5. Persistent numbness or new problems with movement should be communicated to the surgeon or the [facility] [phone])/ [facility] [phone]).

## 2017-02-19 NOTE — Anesthesia Preprocedure Evaluation (Addendum)
Anesthesia Evaluation  Patient identified by MRN, date of birth, ID band Patient awake    Reviewed: Allergy & Precautions, H&P , NPO status , Patient's Chart, lab work & pertinent test results  Airway Mallampati: II  TM Distance: >3 FB Neck ROM: Full    Dental no notable dental hx. (+) Teeth Intact, Dental Advisory Given   Pulmonary neg pulmonary ROS,    Pulmonary exam normal breath sounds clear to auscultation       Cardiovascular negative cardio ROS   Rhythm:Regular Rate:Normal     Neuro/Psych negative neurological ROS  negative psych ROS   GI/Hepatic Neg liver ROS, GERD  Medicated and Controlled,  Endo/Other  negative endocrine ROS  Renal/GU negative Renal ROS  negative genitourinary   Musculoskeletal   Abdominal   Peds  Hematology negative hematology ROS (+)   Anesthesia Other Findings   Reproductive/Obstetrics negative OB ROS                            Anesthesia Physical Anesthesia Plan  ASA: II  Anesthesia Plan: General   Post-op Pain Management:  Regional for Post-op pain   Induction: Intravenous  PONV Risk Score and Plan: 3 and Ondansetron, Dexamethasone and Midazolam  Airway Management Planned: LMA  Additional Equipment:   Intra-op Plan:   Post-operative Plan: Extubation in OR  Informed Consent: I have reviewed the patients History and Physical, chart, labs and discussed the procedure including the risks, benefits and alternatives for the proposed anesthesia with the patient or authorized representative who has indicated his/her understanding and acceptance.   Dental advisory given  Plan Discussed with: CRNA  Anesthesia Plan Comments:         Anesthesia Quick Evaluation

## 2017-02-19 NOTE — Progress Notes (Addendum)
Assisted Dr. Autumn PattyEdmond Fitzgerald with left, ultrasound guided, femoral block. Side rails up, monitors on throughout procedure. See vital signs in flow sheet. Tolerated Procedure well. Time out was at 1200 hrs and femoral block procedure completed at 1208 hrs.

## 2017-02-19 NOTE — H&P (Addendum)
     ORTHOPAEDIC CONSULTATION  REQUESTING PHYSICIAN: Renette Butters, MD  Chief Complaint: L patella fracture  HPI: Nathan Davenport is a 36 y.o. male who complains of a fall on the ice  Past Medical History:  Diagnosis Date  . GERD (gastroesophageal reflux disease)    Past Surgical History:  Procedure Laterality Date  . UPPER GI ENDOSCOPY     Social History   Socioeconomic History  . Marital status: Married    Spouse name: None  . Number of children: None  . Years of education: None  . Highest education level: None  Social Needs  . Financial resource strain: None  . Food insecurity - worry: None  . Food insecurity - inability: None  . Transportation needs - medical: None  . Transportation needs - non-medical: None  Occupational History  . None  Tobacco Use  . Smoking status: Never Smoker  . Smokeless tobacco: Never Used  Substance and Sexual Activity  . Alcohol use: No  . Drug use: None  . Sexual activity: None  Other Topics Concern  . None  Social History Narrative  . None   No family history on file. Allergies  Allergen Reactions  . Sulfa Antibiotics Hives   Prior to Admission medications   Medication Sig Start Date End Date Taking? Authorizing Provider  pantoprazole (PROTONIX) 40 MG tablet Take 40 mg by mouth daily.   Yes [provider]   Dg Knee Complete 4 Views Left  Result Date: 02/19/2017 CLINICAL DATA:  [place] tonight. EXAM: LEFT KNEE - COMPLETE 4+ VIEW COMPARISON:  None. FINDINGS: Comminuted patellar fracture sugar with marked fracture fragment separation and rotation. Femur, tibia and fibula appear intact. IMPRESSION: Comminuted, widely separated patellar fracture. Electronically Signed   By: Andreas Newport M.D.   On: 02/19/2017 01:38    Positive ROS: All other systems have been reviewed and were otherwise negative with the exception of those mentioned in the HPI and as above.  Labs cbc No results for input(s): WBC,  HGB, HCT, PLT in the last 72 hours.  Labs inflam No results for input(s): CRP in the last 72 hours.  Invalid input(s): ESR  Labs coag No results for input(s): INR, PTT in the last 72 hours.  Invalid input(s): PT  No results for input(s): NA, K, CL, CO2, GLUCOSE, BUN, CREATININE, CALCIUM in the last 72 hours.  Physical Exam: Vitals:   02/19/17 0230 02/19/17 0325  BP: 132/69 132/72  Pulse: 87 (!) 103  Resp: 18 20  Temp:  98.4 F (36.9 C)  SpO2: 98% 97%   General: Alert, no acute distress Cardiovascular: No pedal edema Respiratory: No cyanosis, no use of accessory musculature GI: No organomegaly, abdomen is soft and non-tender Skin: No lesions in the area of chief complaint other than those listed below in MSK exam.  Neurologic: Sensation intact distally save for the below mentioned MSK exam Psychiatric: Patient is competent for consent with normal mood and affect Lymphatic: No axillary or cervical lymphadenopathy  MUSCULOSKELETAL:  LLE: intact skin, no extensor strength, compartments soft Other extremities are atraumatic with painless ROM and NVI.  Assessment: L patella fractue and capsul rupture  Plan: Plan for ORIF and capsul repair Will admit over night for pain control and mobilization  Renette Butters, MD Cell [phone]   02/19/2017 7:28 AM

## 2017-02-19 NOTE — Progress Notes (Signed)
Orthopedic Tech Progress Note Patient Details:  Nathan AmabileSameh Davenport 1980/05/05 [id number]  Ortho Devices Type of Ortho Device: Crutches, Knee Immobilizer Ortho Device/Splint Location: lle Ortho Device/Splint Interventions: Ordered, Application, Adjustment   Post Interventions Patient Tolerated: Well Instructions Provided: Care of device, Adjustment of device   [name], [name] 02/19/2017, 1:57 AM

## 2017-02-19 NOTE — Anesthesia Procedure Notes (Signed)
Anesthesia Regional Block: Femoral nerve block   Pre-Anesthetic Checklist: ,, timeout performed, Correct Patient, Correct Site, Correct Laterality, Correct Procedure, Correct Position, site marked, Risks and benefits discussed, pre-op evaluation,  At surgeon's request and post-op pain management  Laterality: Left  Prep: Maximum Sterile Barrier Precautions used, chloraprep       Needles:  Injection technique: Single-shot  Needle Type: Echogenic Stimulator Needle     Needle Length: 5cm  Needle Gauge: 22     Additional Needles:   Procedures:,,,, ultrasound used (permanent image in chart),,,,  Narrative:  Start time: 02/19/2017 11:57 AM End time: 02/19/2017 12:07 PM Injection made incrementally with aspirations every 5 mL. Anesthesiologist: Gaynelle AduFitzgerald, Arilla Hice, MD  Additional Notes: 2% Lidocaine skin wheel.

## 2017-02-19 NOTE — Progress Notes (Signed)
Patient arrived to room 6N11 from ED with left patellar fracture. Alert and oriented x4. VS stable. Pain 9/10. Knee immobilizer in place. Oriented to room and call bell. Will continue to monitor.

## 2017-02-19 NOTE — Interval H&P Note (Signed)
History and Physical Interval Note:  02/19/2017 12:46 PM  Nathan Davenport  has presented today for surgery, with the diagnosis of fracture patella left  The various methods of treatment have been discussed with the patient and family. After consideration of risks, benefits and other options for treatment, the patient has consented to  Procedure(s): OPEN REDUCTION INTERNAL (ORIF) FIXATION PATELLA (Left) as a surgical intervention .  The patient's history has been reviewed, patient examined, no change in status, stable for surgery.  I have reviewed the patient's chart and labs.  Questions were answered to the patient's satisfaction.     MURPHY, TIMOTHY D

## 2017-02-19 NOTE — Transfer of Care (Signed)
Immediate Anesthesia Transfer of Care Note  Patient: Nathan Davenport  Procedure(s) Performed: OPEN REDUCTION INTERNAL (ORIF) FIXATION PATELLA (Left Knee)  Patient Location: PACU  Anesthesia Type:GA combined with regional for post-op pain  Level of Consciousness: sedated  Airway & Oxygen Therapy: Patient Spontanous Breathing and Patient connected to face mask oxygen  Post-op Assessment: Report given to RN and Post -op Vital signs reviewed and stable  Post vital signs: Reviewed and stable  Last Vitals:  Vitals:   02/19/17 1235 02/19/17 1424  BP: 125/71 102/68  Pulse: 87 (!) 103  Resp: 16 (!) 8  Temp:  (P) 36.6 C  SpO2: 96% 98%    Last Pain:  Vitals:   02/19/17 1424  TempSrc:   PainSc: (P) Asleep      Patients Stated Pain Goal: 3 (02/19/17 1200)  Complications: No apparent anesthesia complications

## 2017-02-19 NOTE — Discharge Summary (Signed)
Physician Discharge Summary  Patient ID: Nathan Davenport MRN: [id number] DOB/AGE: Apr 08, 1980 36 y.o.  Admit date: 02/19/2017 Discharge date: 02/19/2017  Admission Diagnoses:  <principal problem not specified>  Discharge Diagnoses:  Active Problems:   Patella fracture   Past Medical History:  Diagnosis Date  . GERD (gastroesophageal reflux disease)     Surgeries: Procedure(s): OPEN REDUCTION INTERNAL (ORIF) FIXATION PATELLA on 02/19/2017   Consultants (if any):   Discharged Condition: Improved  Hospital Course: Nathan AmabileSameh Wong is an 36 y.o. male who was admitted 02/19/2017 with a diagnosis of <principal problem not specified> and went to the operating room on 02/19/2017 and underwent the above named procedures.    He was given perioperative antibiotics:  Anti-infectives (From admission, onward)   None    .  He was given sequential compression devices, early ambulation, and ASA for DVT prophylaxis.  He benefited maximally from the hospital stay and there were no complications.    Recent vital signs:  Vitals:   02/19/17 0230 02/19/17 0325  BP: 132/69 132/72  Pulse: 87 (!) 103  Resp: 18 20  Temp:  98.4 F (36.9 C)  SpO2: 98% 97%    Recent laboratory studies:  Lab Results  Component Value Date   HGB 14.0 12/17/2016   Lab Results  Component Value Date   WBC 6.8 12/17/2016   PLT 216 12/17/2016   No results found for: INR Lab Results  Component Value Date   NA 140 12/17/2016   K 3.6 12/17/2016   CL 106 12/17/2016   CO2 23 12/17/2016   BUN 13 12/17/2016   CREATININE 1.16 12/17/2016   GLUCOSE 88 12/17/2016    Discharge Medications:   Allergies as of 02/19/2017      Reactions   Sulfa Antibiotics Hives      Medication List    TAKE these medications   pantoprazole 40 MG tablet Commonly known as:  PROTONIX Take 40 mg by mouth daily.       Diagnostic Studies: Dg Knee Complete 4 Views Left  Result Date: 02/19/2017 CLINICAL DATA:  [place]at[place]ell on the ice  tonight. EXAM: LEFT KNEE - COMPLETE 4+ VIEW COMPARISON:  None. FINDINGS: Comminuted patellar fracture sugar with marked fracture fragment separation and rotation. Femur, tibia and fibula appear intact. IMPRESSION: Comminuted, widely separated patellar fracture. Electronically Signed   By: Ellery Plunkaniel R Mitchell M.D.   On: 02/19/2017 01:38    Disposition: 30-Still a Patient       Signed: Sheral ApleyMURPHY, Deonne Rooks D 02/19/2017, 2:12 PM

## 2017-02-19 NOTE — H&P (View-Only) (Signed)
     ORTHOPAEDIC CONSULTATION  REQUESTING PHYSICIAN: Renette Butters, MD  Chief Complaint: L patella fracture  HPI: Nathan Davenport is a 36 y.o. male who complains of a fall on the ice  Past Medical History:  Diagnosis Date  . GERD (gastroesophageal reflux disease)    Past Surgical History:  Procedure Laterality Date  . UPPER GI ENDOSCOPY     Social History   Socioeconomic History  . Marital status: Married    Spouse name: None  . Number of children: None  . Years of education: None  . Highest education level: None  Social Needs  . Financial resource strain: None  . Food insecurity - worry: None  . Food insecurity - inability: None  . Transportation needs - medical: None  . Transportation needs - non-medical: None  Occupational History  . None  Tobacco Use  . Smoking status: Never Smoker  . Smokeless tobacco: Never Used  Substance and Sexual Activity  . Alcohol use: No  . Drug use: None  . Sexual activity: None  Other Topics Concern  . None  Social History Narrative  . None   No family history on file. Allergies  Allergen Reactions  . Sulfa Antibiotics Hives   Prior to Admission medications   Medication Sig Start Date End Date Taking? Authorizing Provider  pantoprazole (PROTONIX) 40 MG tablet Take 40 mg by mouth daily.   Yes [provider]   Dg Knee Complete 4 Views Left  Result Date: 02/19/2017 CLINICAL DATA:  [place] tonight. EXAM: LEFT KNEE - COMPLETE 4+ VIEW COMPARISON:  None. FINDINGS: Comminuted patellar fracture sugar with marked fracture fragment separation and rotation. Femur, tibia and fibula appear intact. IMPRESSION: Comminuted, widely separated patellar fracture. Electronically Signed   By: Andreas Newport M.D.   On: 02/19/2017 01:38    Positive ROS: All other systems have been reviewed and were otherwise negative with the exception of those mentioned in the HPI and as above.  Labs cbc No results for input(s): WBC,  HGB, HCT, PLT in the last 72 hours.  Labs inflam No results for input(s): CRP in the last 72 hours.  Invalid input(s): ESR  Labs coag No results for input(s): INR, PTT in the last 72 hours.  Invalid input(s): PT  No results for input(s): NA, K, CL, CO2, GLUCOSE, BUN, CREATININE, CALCIUM in the last 72 hours.  Physical Exam: Vitals:   02/19/17 0230 02/19/17 0325  BP: 132/69 132/72  Pulse: 87 (!) 103  Resp: 18 20  Temp:  98.4 F (36.9 C)  SpO2: 98% 97%   General: Alert, no acute distress Cardiovascular: No pedal edema Respiratory: No cyanosis, no use of accessory musculature GI: No organomegaly, abdomen is soft and non-tender Skin: No lesions in the area of chief complaint other than those listed below in MSK exam.  Neurologic: Sensation intact distally save for the below mentioned MSK exam Psychiatric: Patient is competent for consent with normal mood and affect Lymphatic: No axillary or cervical lymphadenopathy  MUSCULOSKELETAL:  LLE: intact skin, no extensor strength, compartments soft Other extremities are atraumatic with painless ROM and NVI.  Assessment: L patella fractue and capsul rupture  Plan: Plan for ORIF and capsul repair Will admit over night for pain control and mobilization  Renette Butters, MD Cell [phone]   02/19/2017 7:28 AM

## 2017-02-20 ENCOUNTER — Encounter (HOSPITAL_BASED_OUTPATIENT_CLINIC_OR_DEPARTMENT_OTHER): Payer: Self-pay | Admitting: Orthopedic Surgery

## 2017-03-10 ENCOUNTER — Ambulatory Visit (HOSPITAL_COMMUNITY)
Admission: RE | Admit: 2017-03-10 | Discharge: 2017-03-10 | Disposition: A | Discharge status: Home / Self Care | Payer: BLUE CROSS/BLUE SHIELD | Source: Ambulatory Visit | Attending: Orthopedic Surgery | Admitting: Orthopedic Surgery

## 2017-03-10 ENCOUNTER — Other Ambulatory Visit: Payer: Self-pay | Admitting: Orthopedic Surgery

## 2017-03-10 DIAGNOSIS — M7989 Other specified soft tissue disorders: Secondary | ICD-10-CM

## 2017-03-10 NOTE — Progress Notes (Signed)
Left lower extremity venous duplex completed. No evidence of DVT, superficial thrombosis, or Baker's cyst. Toma DeitersVirginia Ryun Davenport, RVS  03/10/2017, 5:17 pm

## 2018-08-02 IMAGING — DX DG KNEE COMPLETE 4+V*L*
4 series · 4 of 4 positions shown · non-contrast
Comparison: None.

CLINICAL DATA: Fell on the ice tonight.

EXAM:
LEFT KNEE - COMPLETE 4+ VIEW

[knee ap]
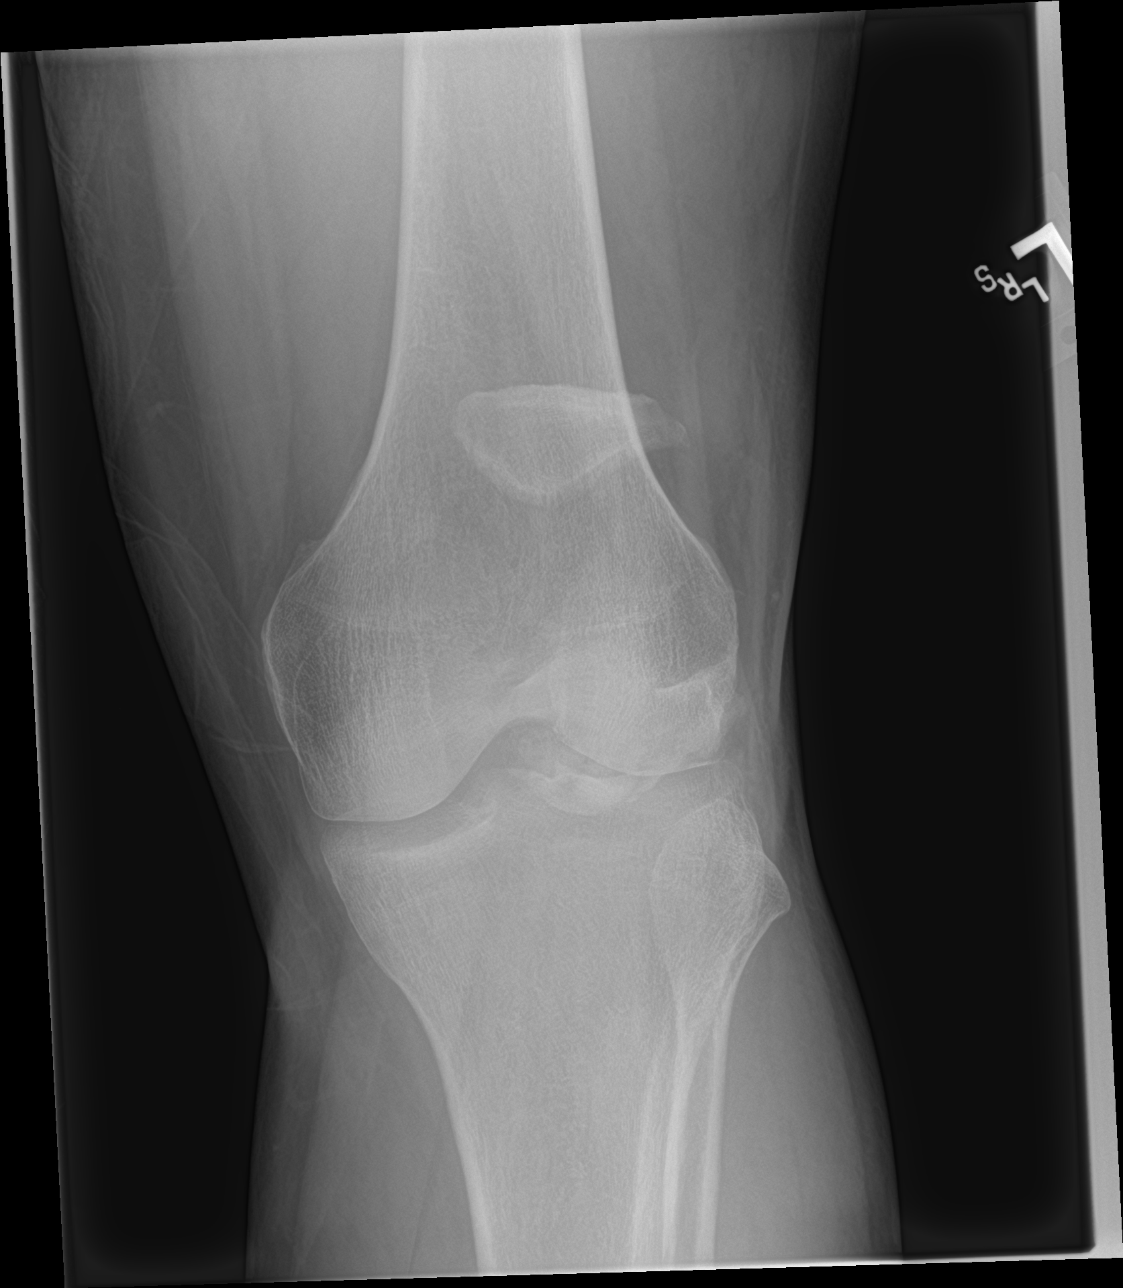

[knee lat]
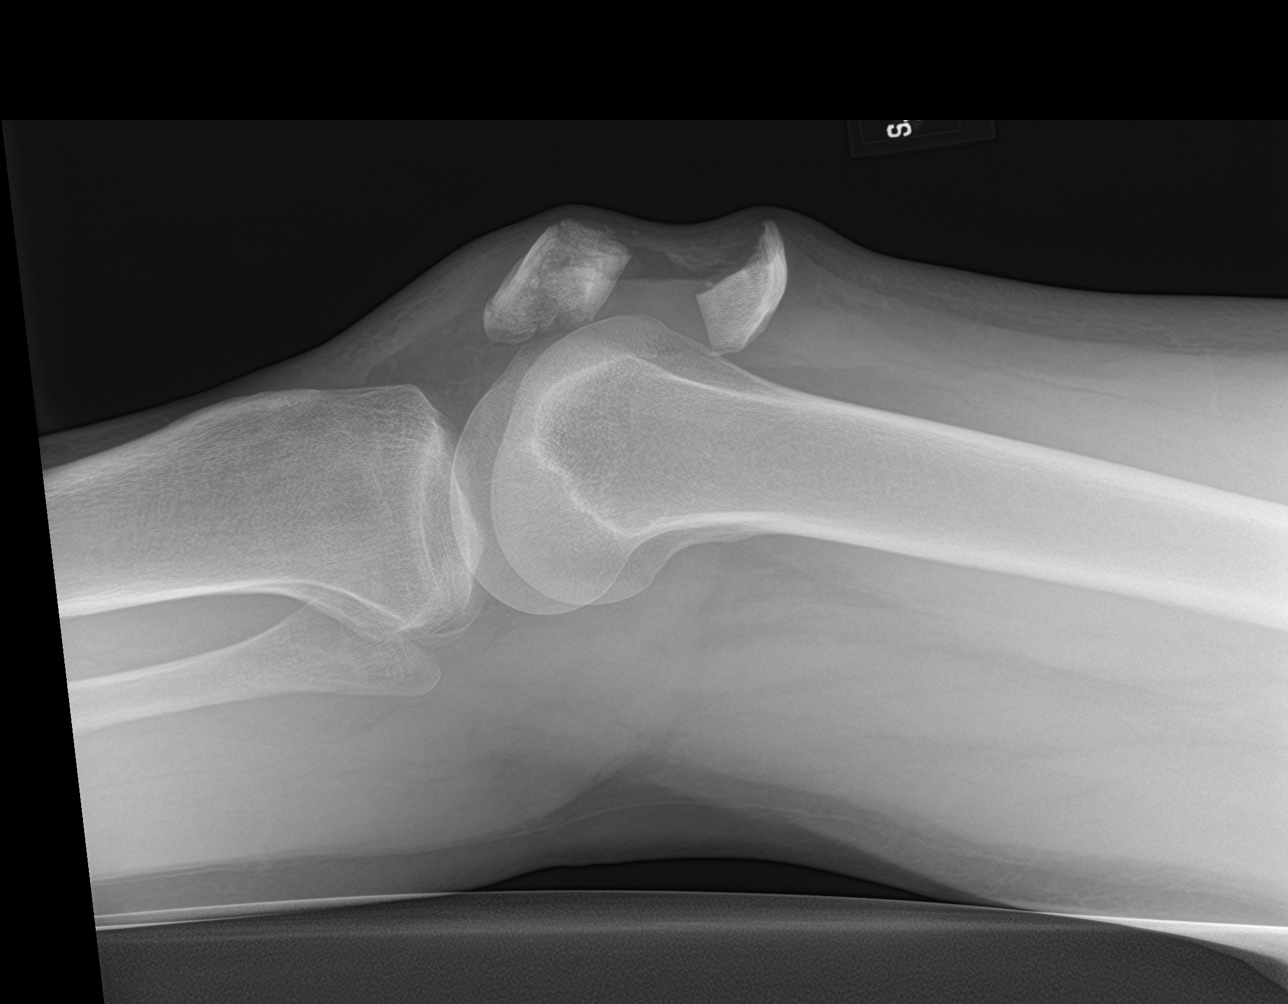

[knee obl (1 of 2)]
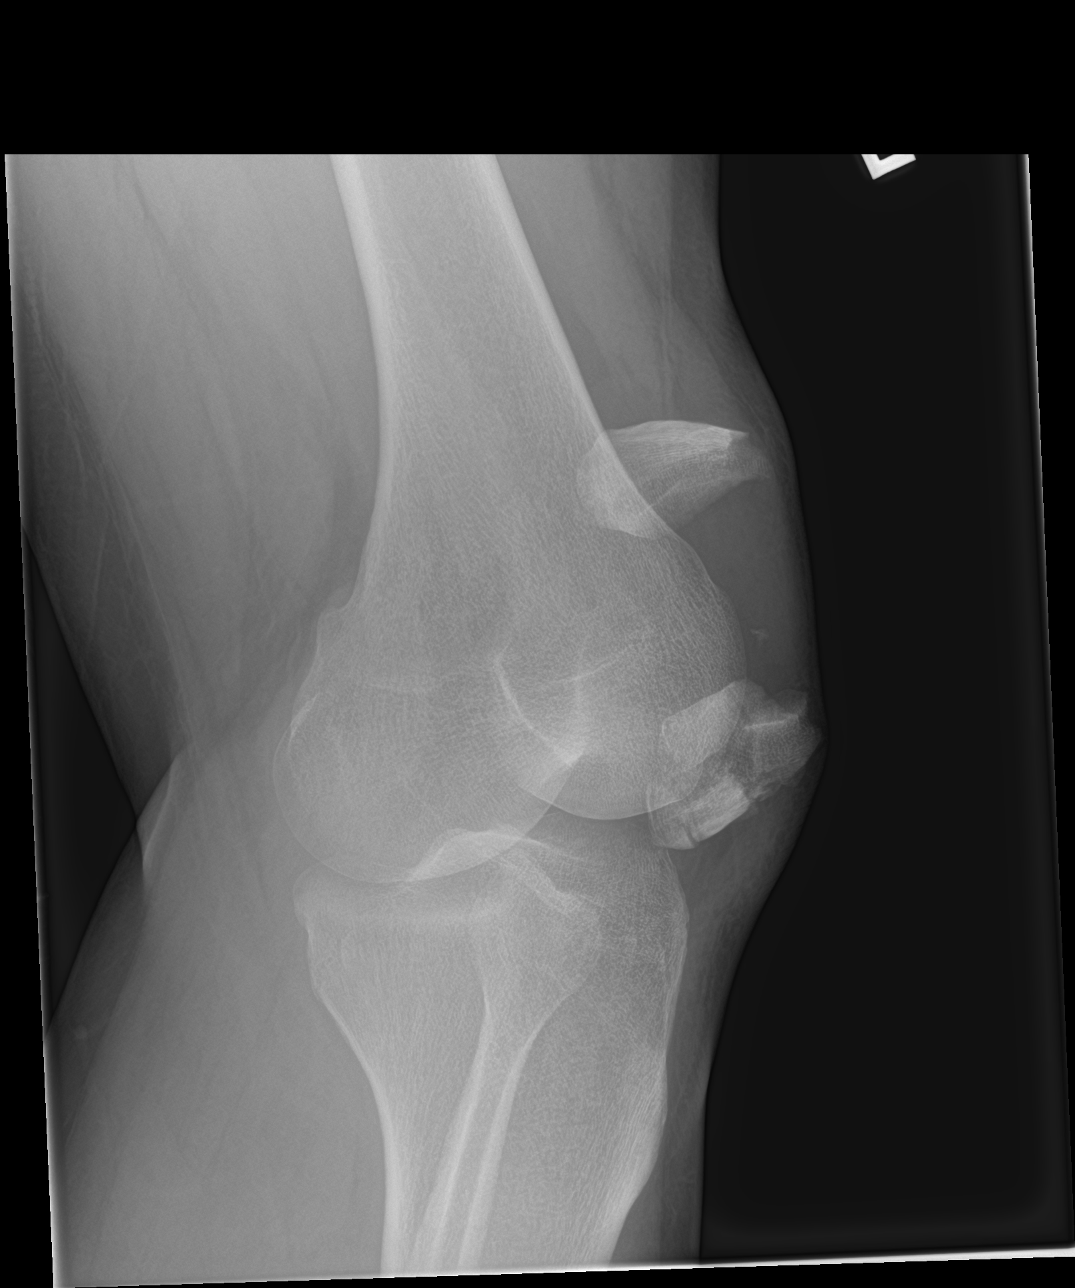

[knee obl (2 of 2)]
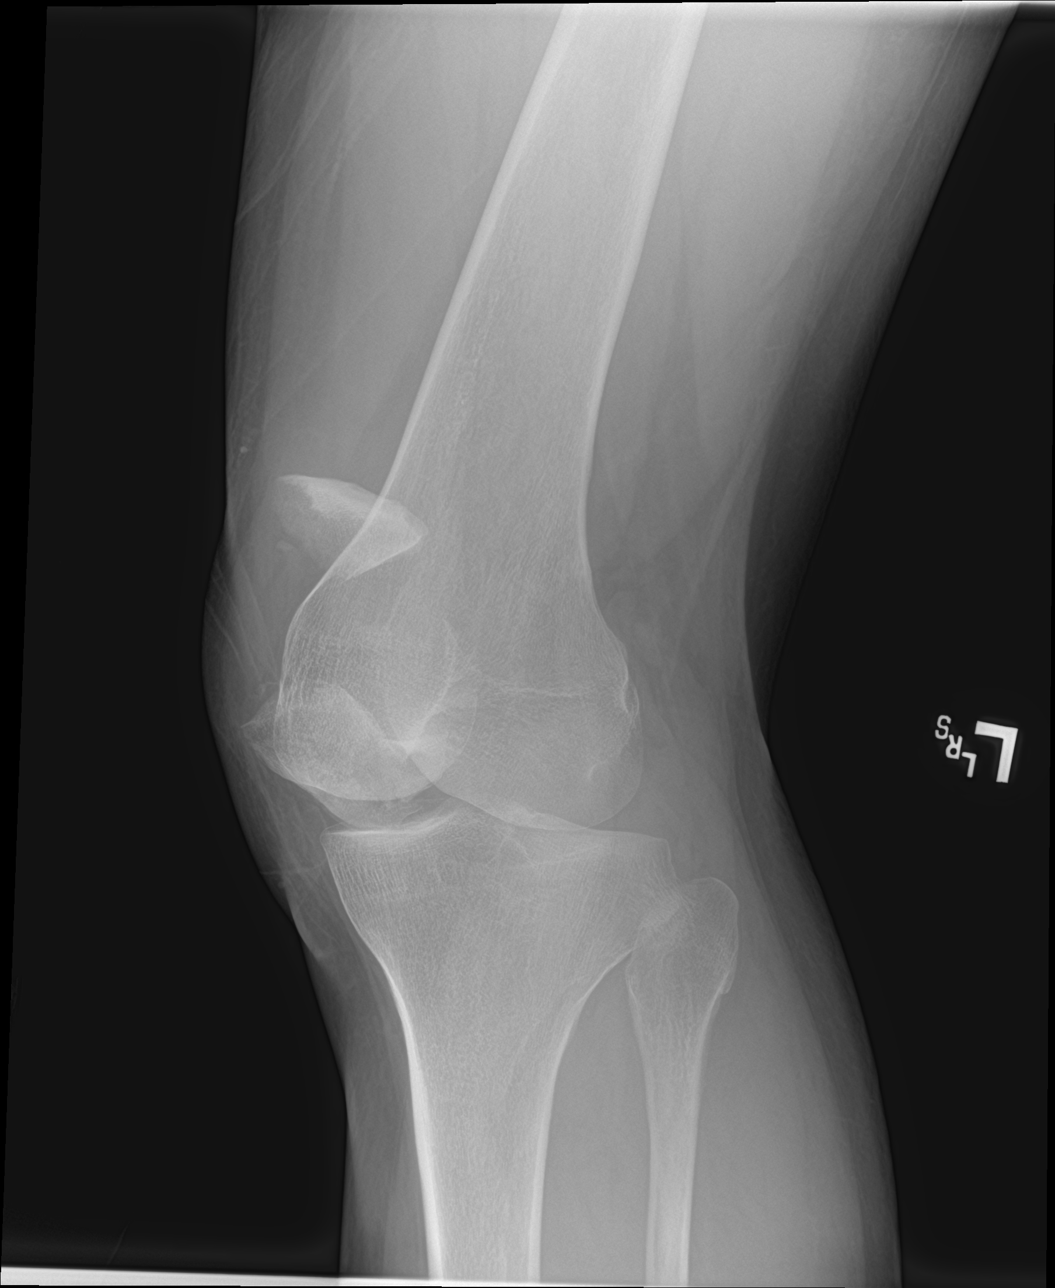

[4 of 4 positions shown; findings below may reference images not displayed]

FINDINGS: Comminuted patellar fracture sugar with marked fracture fragment
separation and rotation. Femur, tibia and fibula appear intact.
IMPRESSION: Comminuted, widely separated patellar fracture.
